# Patient Record
Sex: Female | Born: 1955 | Hispanic: Yes | Smoking: Former smoker
Health system: Southern US, Community
[De-identification: ages and names within clinical notes are randomized; demographics above are authoritative.]

## PROBLEM LIST (undated history)

## (undated) DIAGNOSIS — F329 Major depressive disorder, single episode, unspecified: Secondary | ICD-10-CM

## (undated) DIAGNOSIS — I10 Essential (primary) hypertension: Secondary | ICD-10-CM

## (undated) DIAGNOSIS — F32A Depression, unspecified: Secondary | ICD-10-CM

## (undated) DIAGNOSIS — R32 Unspecified urinary incontinence: Secondary | ICD-10-CM

## (undated) DIAGNOSIS — E785 Hyperlipidemia, unspecified: Secondary | ICD-10-CM

## (undated) DIAGNOSIS — F419 Anxiety disorder, unspecified: Secondary | ICD-10-CM

## (undated) HISTORY — PX: GALLBLADDER SURGERY: SHX652

## (undated) HISTORY — DX: Essential (primary) hypertension: I10

## (undated) HISTORY — DX: Hyperlipidemia, unspecified: E78.5

---

## 2000-09-03 ENCOUNTER — Emergency Department (HOSPITAL_COMMUNITY): Admission: EM | Admit: 2000-09-03 | Discharge: 2000-09-03 | Payer: Self-pay | Admitting: Emergency Medicine

## 2012-07-24 ENCOUNTER — Telehealth (INDEPENDENT_AMBULATORY_CARE_PROVIDER_SITE_OTHER): Payer: Self-pay | Admitting: Surgery

## 2012-07-24 NOTE — Telephone Encounter (Signed)
I received a request from [facility] in [place] for transfer of care of Realize Band follow-up/adjustments. Per Dr. Martin, it is ok to accept this patient for band adjustments and that the patient should be scheduled to see Andy Liepins, PA @ [facility]. Appointment scheduled for 08/06/12. Notified [facility] of appointment date and time. I also advised Christy @ [facility] that I need the patient demographic information and insurance information as soon as possible. cef °

## 2012-08-06 ENCOUNTER — Encounter (INDEPENDENT_AMBULATORY_CARE_PROVIDER_SITE_OTHER): Payer: Self-pay

## 2012-10-22 ENCOUNTER — Encounter (INDEPENDENT_AMBULATORY_CARE_PROVIDER_SITE_OTHER): Payer: Self-pay

## 2012-10-22 ENCOUNTER — Ambulatory Visit (INDEPENDENT_AMBULATORY_CARE_PROVIDER_SITE_OTHER): Payer: BC Managed Care – PPO | Admitting: Physician Assistant

## 2012-10-22 VITALS — BP 122/80 | HR 68 | Resp 16 | Ht 61.75 in | Wt 203.4 lb

## 2012-10-22 DIAGNOSIS — Z9884 Bariatric surgery status: Secondary | ICD-10-CM | POA: Insufficient documentation

## 2012-10-22 DIAGNOSIS — Z4651 Encounter for fitting and adjustment of gastric lap band: Secondary | ICD-10-CM

## 2012-10-22 NOTE — Progress Notes (Signed)
  HISTORY: Alexa Rice is a 57 y.o.female who received a Realize band in June 2010 in [place]. She is transferring her care here today. She has had several adjustments since her operation but we have no records of her fills. She reports doing well overall with the band but she's having increasing hunger and portions since last being seen in clinic at the beginning of the summer. She denies regurgitation or reflux symptoms. She would like a fill today.  VITAL SIGNS: Filed Vitals:   10/22/12 1213  BP: 122/80  Pulse: 68  Resp: 16    PHYSICAL EXAM: Physical exam reveals a very well-appearing 57 y.o.female in no apparent distress Neurologic: Awake, alert, oriented Psych: Bright affect, conversant Respiratory: Breathing even and unlabored. No stridor or wheezing Abdomen: Soft, nontender, nondistended to palpation. Incisions well-healed. No incisional hernias. Port easily palpated. Extremities: Atraumatic, good range of motion.  ASSESMENT: 57 y.o.  female  s/p Realize band.   PLAN: The patient's port was accessed with a 20G Huber needle without difficulty. Clear fluid was aspirated and 0.5 mL saline was added to the port. The patient was able to swallow water without difficulty following the procedure and was instructed to take clear liquids for the next 24-48 hours and advance slowly as tolerated.

## 2012-10-22 NOTE — Patient Instructions (Signed)

## 2012-11-03 ENCOUNTER — Encounter (INDEPENDENT_AMBULATORY_CARE_PROVIDER_SITE_OTHER): Payer: Self-pay

## 2012-11-19 ENCOUNTER — Encounter (INDEPENDENT_AMBULATORY_CARE_PROVIDER_SITE_OTHER): Payer: BC Managed Care – PPO

## 2013-03-12 ENCOUNTER — Emergency Department (HOSPITAL_BASED_OUTPATIENT_CLINIC_OR_DEPARTMENT_OTHER): Payer: BC Managed Care – PPO

## 2013-03-12 ENCOUNTER — Emergency Department (HOSPITAL_BASED_OUTPATIENT_CLINIC_OR_DEPARTMENT_OTHER)
Admission: EM | Admit: 2013-03-12 | Discharge: 2013-03-12 | Disposition: A | Discharge status: Home / Self Care | Payer: BC Managed Care – PPO | Attending: Emergency Medicine | Admitting: Emergency Medicine

## 2013-03-12 ENCOUNTER — Encounter (HOSPITAL_BASED_OUTPATIENT_CLINIC_OR_DEPARTMENT_OTHER): Payer: Self-pay | Admitting: Emergency Medicine

## 2013-03-12 DIAGNOSIS — Y9389 Activity, other specified: Secondary | ICD-10-CM | POA: Insufficient documentation

## 2013-03-12 DIAGNOSIS — S0990XA Unspecified injury of head, initial encounter: Secondary | ICD-10-CM | POA: Insufficient documentation

## 2013-03-12 DIAGNOSIS — Z79899 Other long term (current) drug therapy: Secondary | ICD-10-CM | POA: Insufficient documentation

## 2013-03-12 DIAGNOSIS — S59919A Unspecified injury of unspecified forearm, initial encounter: Secondary | ICD-10-CM

## 2013-03-12 DIAGNOSIS — Z880 Allergy status to penicillin: Secondary | ICD-10-CM | POA: Insufficient documentation

## 2013-03-12 DIAGNOSIS — Z87891 Personal history of nicotine dependence: Secondary | ICD-10-CM | POA: Insufficient documentation

## 2013-03-12 DIAGNOSIS — S161XXA Strain of muscle, fascia and tendon at neck level, initial encounter: Secondary | ICD-10-CM

## 2013-03-12 DIAGNOSIS — S6990XA Unspecified injury of unspecified wrist, hand and finger(s), initial encounter: Secondary | ICD-10-CM | POA: Insufficient documentation

## 2013-03-12 DIAGNOSIS — I10 Essential (primary) hypertension: Secondary | ICD-10-CM | POA: Insufficient documentation

## 2013-03-12 DIAGNOSIS — F3289 Other specified depressive episodes: Secondary | ICD-10-CM | POA: Insufficient documentation

## 2013-03-12 DIAGNOSIS — S4980XA Other specified injuries of shoulder and upper arm, unspecified arm, initial encounter: Secondary | ICD-10-CM | POA: Insufficient documentation

## 2013-03-12 DIAGNOSIS — S139XXA Sprain of joints and ligaments of unspecified parts of neck, initial encounter: Secondary | ICD-10-CM | POA: Insufficient documentation

## 2013-03-12 DIAGNOSIS — S59909A Unspecified injury of unspecified elbow, initial encounter: Secondary | ICD-10-CM | POA: Insufficient documentation

## 2013-03-12 DIAGNOSIS — Y92410 Unspecified street and highway as the place of occurrence of the external cause: Secondary | ICD-10-CM | POA: Insufficient documentation

## 2013-03-12 DIAGNOSIS — T148XXA Other injury of unspecified body region, initial encounter: Secondary | ICD-10-CM

## 2013-03-12 DIAGNOSIS — S46909A Unspecified injury of unspecified muscle, fascia and tendon at shoulder and upper arm level, unspecified arm, initial encounter: Secondary | ICD-10-CM | POA: Insufficient documentation

## 2013-03-12 DIAGNOSIS — F411 Generalized anxiety disorder: Secondary | ICD-10-CM | POA: Insufficient documentation

## 2013-03-12 DIAGNOSIS — F329 Major depressive disorder, single episode, unspecified: Secondary | ICD-10-CM | POA: Insufficient documentation

## 2013-03-12 DIAGNOSIS — Z862 Personal history of diseases of the blood and blood-forming organs and certain disorders involving the immune mechanism: Secondary | ICD-10-CM | POA: Insufficient documentation

## 2013-03-12 DIAGNOSIS — Z8639 Personal history of other endocrine, nutritional and metabolic disease: Secondary | ICD-10-CM | POA: Insufficient documentation

## 2013-03-12 DIAGNOSIS — S298XXA Other specified injuries of thorax, initial encounter: Secondary | ICD-10-CM | POA: Insufficient documentation

## 2013-03-12 HISTORY — DX: Anxiety disorder, unspecified: F41.9

## 2013-03-12 HISTORY — DX: Depression, unspecified: F32.A

## 2013-03-12 HISTORY — DX: Unspecified urinary incontinence: R32

## 2013-03-12 HISTORY — DX: Major depressive disorder, single episode, unspecified: F32.9

## 2013-03-12 MED ORDER — HYDROCODONE-ACETAMINOPHEN 5-325 MG PO TABS: 1.0000 | ORAL_TABLET | Freq: Four times a day (QID) | ORAL | Status: DC | PRN

## 2013-03-12 MED ORDER — HYDROCODONE-ACETAMINOPHEN 5-325 MG PO TABS
2.0000 | ORAL_TABLET | Freq: Once | ORAL | Status: AC
  Administered 2013-03-12: 2 via ORAL
  Filled 2013-03-12: qty 2
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
## 2013-03-12 NOTE — Discharge Instructions (Signed)
Cervical Sprain °A cervical sprain is an injury in the neck in which the strong, fibrous tissues (ligaments) that connect your neck bones stretch or tear. Cervical sprains can range from mild to severe. Severe cervical sprains can cause the neck vertebrae to be unstable. This can lead to damage of the spinal cord and can result in serious nervous system problems. The amount of time it takes for a cervical sprain to get better depends on the cause and extent of the injury. Most cervical sprains heal in 1 to 3 weeks. °CAUSES  °Severe cervical sprains may be caused by:  °· Contact sport injuries (such as from football, rugby, wrestling, hockey, auto racing, gymnastics, diving, martial arts, or boxing).   °· Motor vehicle collisions.   °· Whiplash injuries. This is an injury from a sudden forward-and backward whipping movement of the head and neck.  °· Falls.   °Mild cervical sprains may be caused by:  °· Being in an awkward position, such as while cradling a telephone between your ear and shoulder.   °· Sitting in a chair that does not offer proper support.   °· Working at a poorly designed computer station.   °· Looking up or down for long periods of time.   °SYMPTOMS  °· Pain, soreness, stiffness, or a burning sensation in the front, back, or sides of the neck. This discomfort may develop immediately after the injury or slowly, 24 hours or more after the injury.   °· Pain or tenderness directly in the middle of the back of the neck.   °· Shoulder or upper back pain.   °· Limited ability to move the neck.   °· Headache.   °· Dizziness.   °· Weakness, numbness, or tingling in the hands or arms.   °· Muscle spasms.   °· Difficulty swallowing or chewing.   °· Tenderness and swelling of the neck.   °DIAGNOSIS  °Most of the time your health care provider can diagnose a cervical sprain by taking your history and doing a physical exam. Your health care provider will ask about previous neck injuries and any known neck  problems, such as arthritis in the neck. X-rays may be taken to find out if there are any other problems, such as with the bones of the neck. Other tests, such as a CT scan or MRI, may also be needed.  °TREATMENT  °Treatment depends on the severity of the cervical sprain. Mild sprains can be treated with rest, keeping the neck in place (immobilization), and pain medicines. Severe cervical sprains are immediately immobilized. Further treatment is done to help with pain, muscle spasms, and other symptoms and may include: °· Medicines, such as pain relievers, numbing medicines, or muscle relaxants.   °· Physical therapy. This may involve stretching exercises, strengthening exercises, and posture training. Exercises and improved posture can help stabilize the neck, strengthen muscles, and help stop symptoms from returning.   °HOME CARE INSTRUCTIONS  °· Put ice on the injured area.   °· Put ice in a plastic bag.   °· Place a towel between your skin and the bag.   °· Leave the ice on for 15 20 minutes, 3 4 times a day.   °· If your injury was severe, you may have been given a cervical collar to wear. A cervical collar is a two-piece collar designed to keep your neck from moving while it heals. °· Do not remove the collar unless instructed by your health care provider. °· If you have long hair, keep it outside of the collar. °· Ask your health care provider before making any adjustments to your collar.   Minor adjustments may be required over time to improve comfort and reduce pressure on your chin or on the back of your head. °· If you are allowed to remove the collar for cleaning or bathing, follow your health care provider's instructions on how to do so safely. °· Keep your collar clean by wiping it with mild soap and water and drying it completely. If the collar you have been given includes removable pads, remove them every 1 2 days and hand wash them with soap and water. Allow them to air dry. They should be completely  dry before you wear them in the collar. °· If you are allowed to remove the collar for cleaning and bathing, wash and dry the skin of your neck. Check your skin for irritation or sores. If you see any, tell your health care provider. °· Do not drive while wearing the collar.   °· Only take over-the-counter or prescription medicines for pain, discomfort, or fever as directed by your health care provider.   °· Keep all follow-up appointments as directed by your health care provider.   °· Keep all physical therapy appointments as directed by your health care provider.   °· Make any needed adjustments to your workstation to promote good posture.   °· Avoid positions and activities that make your symptoms worse.   °· Warm up and stretch before being active to help prevent problems.   °SEEK MEDICAL CARE IF:  °· Your pain is not controlled with medicine.   °· You are unable to decrease your pain medicine over time as planned.   °· Your activity level is not improving as expected.   °SEEK IMMEDIATE MEDICAL CARE IF:  °· You develop any bleeding. °· You develop stomach upset. °· You have signs of an allergic reaction to your medicine.   °· Your symptoms get worse.   °· You develop new, unexplained symptoms.   °· You have numbness, tingling, weakness, or paralysis in any part of your body.   °MAKE SURE YOU:  °· Understand these instructions. °· Will watch your condition. °· Will get help right away if you are not doing well or get worse. °Document Released: 11/18/2006 Document Revised: 11/11/2012 Document Reviewed: 07/29/2012 °[employer]® Patient Information ©2014 [employer]. ° °Motor Vehicle Collision  °It is common to have multiple bruises and sore muscles after a motor vehicle collision (MVC). These tend to feel worse for the first 24 hours. You may have the most stiffness and soreness over the first several hours. You may also feel worse when you wake up the first morning after your collision. After this point, you will  usually begin to improve with each day. The speed of improvement often depends on the severity of the collision, the number of injuries, and the location and nature of these injuries. °HOME CARE INSTRUCTIONS  °· Put ice on the injured area. °· Put ice in a plastic bag. °· Place a towel between your skin and the bag. °· Leave the ice on for 15-20 minutes, 03-04 times a day. °· Drink enough fluids to keep your urine clear or pale yellow. Do not drink alcohol. °· Take a warm shower or bath once or twice a day. This will increase blood flow to sore muscles. °· You may return to activities as directed by your caregiver. Be careful when lifting, as this may aggravate neck or back pain. °· Only take over-the-counter or prescription medicines for pain, discomfort, or fever as directed by your caregiver. Do not use aspirin. This may increase bruising and bleeding. °SEEK IMMEDIATE MEDICAL CARE IF: °·   You have numbness, tingling, or weakness in the arms or legs. °· You develop severe headaches not relieved with medicine. °· You have severe neck pain, especially tenderness in the middle of the back of your neck. °· You have changes in bowel or bladder control. °· There is increasing pain in any area of the body. °· You have shortness of breath, lightheadedness, dizziness, or fainting. °· You have chest pain. °· You feel sick to your stomach (nauseous), throw up (vomit), or sweat. °· You have increasing abdominal discomfort. °· There is blood in your urine, stool, or vomit. °· You have pain in your shoulder (shoulder strap areas). °· You feel your symptoms are getting worse. °MAKE SURE YOU:  °· Understand these instructions. °· Will watch your condition. °· Will get help right away if you are not doing well or get worse. °Document Released: 01/21/2005 Document Revised: 04/15/2011 Document Reviewed: 06/20/2010 °[employer]® Patient Information ©2014 [employer]. ° °

## 2013-03-12 NOTE — ED Notes (Signed)
Patient transported to X-ray 

## 2013-06-03 ENCOUNTER — Ambulatory Visit (INDEPENDENT_AMBULATORY_CARE_PROVIDER_SITE_OTHER): Payer: BC Managed Care – PPO | Admitting: Physician Assistant

## 2013-06-03 ENCOUNTER — Encounter (INDEPENDENT_AMBULATORY_CARE_PROVIDER_SITE_OTHER): Payer: Self-pay

## 2013-06-03 VITALS — BP 128/74 | HR 76 | Temp 98.5°F | Ht 62.0 in | Wt 206.0 lb

## 2013-06-03 DIAGNOSIS — Z4651 Encounter for fitting and adjustment of gastric lap band: Secondary | ICD-10-CM

## 2013-06-03 NOTE — Progress Notes (Signed)
  HISTORY: Alexa Rice is a 58 y.o.female who received an Realize [place]band [place]. She comes in today with 3 lbs weight gain since her last visit in September 2014. She said she felt good restriction in the weeks following her last adjustment but over time, this began to decrease. She is having little issue with hunger but portion sizes are continuing to get larger. She has no complaint of regurgitation unless she eats breads or other foods that she previously had issues with. She was unable to follow-up sooner due to work obligations.  VITAL SIGNS: Filed Vitals:   06/03/13 1152  BP: 128/74  Pulse: 76  Temp: 98.5 F (36.9 C)    PHYSICAL EXAM: Physical exam reveals a very well-appearing 58 y.o.female in no apparent distress Neurologic: Awake, alert, oriented Psych: Bright affect, conversant Respiratory: Breathing even and unlabored. No stridor or wheezing Abdomen: Soft, nontender, nondistended to palpation. Incisions well-healed. No incisional hernias. Port easily palpated. Extremities: Atraumatic, good range of motion.  ASSESMENT: 58 y.o.  female  s/p Realize lap-band.   PLAN: The patient's port was accessed with a 20G Huber needle without difficulty. Clear fluid was aspirated and 0.5 mL saline was added to the port. The patient was able to swallow water without difficulty following the procedure and was instructed to take clear liquids for the next 24-48 hours and advance slowly as tolerated.

## 2013-06-03 NOTE — Patient Instructions (Signed)

## 2013-08-26 ENCOUNTER — Encounter (INDEPENDENT_AMBULATORY_CARE_PROVIDER_SITE_OTHER): Payer: BC Managed Care – PPO

## 2013-09-02 ENCOUNTER — Encounter (INDEPENDENT_AMBULATORY_CARE_PROVIDER_SITE_OTHER): Payer: BC Managed Care – PPO

## 2013-09-16 ENCOUNTER — Encounter (INDEPENDENT_AMBULATORY_CARE_PROVIDER_SITE_OTHER): Payer: BC Managed Care – PPO

## 2013-12-07 ENCOUNTER — Other Ambulatory Visit (HOSPITAL_COMMUNITY): Payer: Self-pay | Admitting: Internal Medicine

## 2013-12-07 DIAGNOSIS — Z1231 Encounter for screening mammogram for malignant neoplasm of breast: Secondary | ICD-10-CM

## 2013-12-17 ENCOUNTER — Ambulatory Visit (HOSPITAL_COMMUNITY)
Admission: RE | Admit: 2013-12-17 | Discharge: 2013-12-17 | Disposition: A | Discharge status: Home / Self Care | Payer: BC Managed Care – PPO | Source: Ambulatory Visit | Attending: Internal Medicine | Admitting: Internal Medicine

## 2013-12-17 DIAGNOSIS — Z1231 Encounter for screening mammogram for malignant neoplasm of breast: Secondary | ICD-10-CM | POA: Diagnosis present

## 2015-04-07 ENCOUNTER — Other Ambulatory Visit: Payer: Self-pay

## 2015-04-07 DIAGNOSIS — M79621 Pain in right upper arm: Secondary | ICD-10-CM

## 2015-05-05 ENCOUNTER — Other Ambulatory Visit: Payer: Self-pay | Admitting: Internal Medicine

## 2015-05-05 DIAGNOSIS — M79621 Pain in right upper arm: Secondary | ICD-10-CM

## 2015-05-12 ENCOUNTER — Other Ambulatory Visit: Payer: BC Managed Care – PPO

## 2015-05-15 ENCOUNTER — Ambulatory Visit
Admission: RE | Admit: 2015-05-15 | Discharge: 2015-05-15 | Disposition: A | Discharge status: Home / Self Care | Payer: BC Managed Care – PPO | Source: Ambulatory Visit | Attending: Internal Medicine | Admitting: Internal Medicine

## 2015-05-15 DIAGNOSIS — M79621 Pain in right upper arm: Secondary | ICD-10-CM

## 2016-04-09 ENCOUNTER — Other Ambulatory Visit: Payer: Self-pay | Admitting: Internal Medicine

## 2016-04-09 DIAGNOSIS — Z1231 Encounter for screening mammogram for malignant neoplasm of breast: Secondary | ICD-10-CM

## 2016-05-20 ENCOUNTER — Ambulatory Visit: Payer: BC Managed Care – PPO

## 2016-07-23 ENCOUNTER — Other Ambulatory Visit: Payer: Self-pay | Admitting: Internal Medicine

## 2016-07-23 DIAGNOSIS — R5381 Other malaise: Secondary | ICD-10-CM

## 2016-08-14 ENCOUNTER — Other Ambulatory Visit: Payer: Self-pay | Admitting: Internal Medicine

## 2016-08-14 DIAGNOSIS — Z780 Asymptomatic menopausal state: Secondary | ICD-10-CM

## 2016-10-30 ENCOUNTER — Other Ambulatory Visit: Payer: BC Managed Care – PPO

## 2016-10-30 ENCOUNTER — Ambulatory Visit: Payer: BC Managed Care – PPO

## 2016-11-11 ENCOUNTER — Inpatient Hospital Stay: Admission: RE | Admit: 2016-11-11 | Payer: BC Managed Care – PPO | Source: Ambulatory Visit

## 2016-11-11 ENCOUNTER — Inpatient Hospital Stay
Admission: RE | Admit: 2016-11-11 | Discharge: 2016-11-11 | Disposition: A | Discharge status: Home / Self Care | Payer: BC Managed Care – PPO | Source: Ambulatory Visit | Attending: Internal Medicine | Admitting: Internal Medicine

## 2016-12-10 ENCOUNTER — Ambulatory Visit
Admission: RE | Admit: 2016-12-10 | Discharge: 2016-12-10 | Disposition: A | Discharge status: Home / Self Care | Payer: BC Managed Care – PPO | Source: Ambulatory Visit | Attending: Internal Medicine | Admitting: Internal Medicine

## 2016-12-10 DIAGNOSIS — Z780 Asymptomatic menopausal state: Secondary | ICD-10-CM

## 2016-12-10 DIAGNOSIS — Z1231 Encounter for screening mammogram for malignant neoplasm of breast: Secondary | ICD-10-CM

## 2021-04-25 ENCOUNTER — Other Ambulatory Visit: Payer: Self-pay | Admitting: Surgical Oncology

## 2021-04-25 DIAGNOSIS — N6320 Unspecified lump in the left breast, unspecified quadrant: Secondary | ICD-10-CM

## 2021-05-07 ENCOUNTER — Ambulatory Visit
Admission: RE | Admit: 2021-05-07 | Discharge: 2021-05-07 | Disposition: A | Discharge status: Home / Self Care | Payer: BC Managed Care – PPO | Source: Ambulatory Visit | Attending: Surgical Oncology | Admitting: Surgical Oncology

## 2021-05-07 ENCOUNTER — Other Ambulatory Visit (HOSPITAL_COMMUNITY): Payer: Self-pay | Admitting: Diagnostic Radiology

## 2021-05-07 ENCOUNTER — Other Ambulatory Visit: Payer: BC Managed Care – PPO

## 2021-05-07 DIAGNOSIS — N6320 Unspecified lump in the left breast, unspecified quadrant: Secondary | ICD-10-CM

## 2021-05-07 HISTORY — PX: BREAST BIOPSY: SHX20

## 2021-05-07 MED ORDER — GADOBUTROL 1 MMOL/ML IV SOLN
8.0000 mL | Freq: Once | INTRAVENOUS | Status: AC | PRN
  Administered 2021-05-07: 8 mL via INTRAVENOUS

## 2021-05-09 ENCOUNTER — Inpatient Hospital Stay: Admission: RE | Admit: 2021-05-09 | Payer: BC Managed Care – PPO | Source: Ambulatory Visit

## 2023-10-21 ENCOUNTER — Other Ambulatory Visit: Payer: Self-pay | Admitting: Medical Genetics

## 2023-11-12 ENCOUNTER — Other Ambulatory Visit

## 2023-11-12 DIAGNOSIS — Z006 Encounter for examination for normal comparison and control in clinical research program: Secondary | ICD-10-CM

## 2023-11-22 LAB — GENECONNECT MOLECULAR SCREEN: Genetic Analysis Overall Interpretation: NEGATIVE

## 2024-01-06 ENCOUNTER — Ambulatory Visit: Admitting: Podiatry

## 2024-01-06 DIAGNOSIS — B351 Tinea unguium: Secondary | ICD-10-CM | POA: Diagnosis not present

## 2024-01-06 NOTE — Progress Notes (Signed)
 Chief Complaint  Patient presents with   Nail Problem    Pt. Had cancer treatment 2023. Unable to attend to nails. Fungal nails bilateral great toes. Using OTC antifungals. Non diabetic.    Discussed the use of AI scribe software for clinical note transcription with the patient, who gave verbal consent to proceed.  History of Present Illness Alexa Rice is a 68 year old female who presents with nail fungus and difficulty in self-care due to reduced mobility.  She has been experiencing nail fungus for approximately two months and has attempted to treat it with over-the-counter products, including patches and a paint-on solution, without any improvement. She is unable to recall the specific names of the products used.  There is a vertical crack in one of her nails (left fourth toenail), but no associated pain or bleeding. No itching between the toes or issues with athlete's foot, either currently or in the past.  Decreased mobility and difficulty reaching her feet have impacted her ability to care for her nails. This difficulty is compounded by her age and vision issues.  In 2023, she underwent cancer treatment, including a mastectomy, and was advised by her oncologist to avoid nail salons to prevent infections due to compromised immunity.    Past Medical History:  Diagnosis Date   Anxiety    Bladder leak    Depression    Hyperlipidemia    Hypertension    Past Surgical History:  Procedure Laterality Date   BREAST BIOPSY Left 05/07/2021   GALLBLADDER SURGERY     Allergies  Allergen Reactions   Penicillins Shortness Of Breath    Synthetic   Physical Exam EXTREMITIES: Left hallux nail with signs of fungal involvement. Samples from bilateral hallux nails obtained for fungal culture. Good pulses in extremities.  Interspaces are clear of maceration and debris.  No peripheral edema is appreciated to the ankles or feet.  No open lesions are noted.  Capillary refill is within  normal limits.  Epicritic sensation is intact  Results Procedure: Nail Clipping for Fungal Culture Description: Samples from bilateral hallux nails were obtained for fungal culture.  The remainder the nails were trimmed with sterile nail nippers.   Assessment/Plan of Care: 1. Dermatophytosis of nail    Assessment & Plan Onychomycosis Chronic onychomycosis with no improvement after two months of over-the-counter treatment. Left hallux nail shows signs of fungal involvement. Differential diagnosis includes various organisms causing nail fungus, some of which may not respond to over-the-counter treatments. Culture results may take up to three weeks. - Trimmed nails today - Sent nail clippings for fungal culture to identify causative organism - Will await culture results in 2-3 weeks to tailor treatment - Scheduled follow-up in three months for re-evaluation and nail trimming  Reduced mobility impacting foot care Reduced mobility due to age and post-cancer treatment, making it difficult to reach and care for feet. No current issues with athlete's foot or itching between toes. - Scheduled follow-up in three months for re-evaluation and nail trimming - However, once the fungus is resolved, she would no longer qualify for this care.  She does not meet the qualifying criteria per Medicare guidelines for ongoing at risk footcare services.  This would be a self-pay service once the fungus has resolved.  Follow-up 3 months for fungal nail recheck/nail trimming   Shlomo Seres DSABRA Imperial, DPM, FACFAS [facility]     [address]  [place], [place] [address]                Office [phone]  Fax [phone]

## 2024-01-12 ENCOUNTER — Other Ambulatory Visit: Payer: Self-pay | Admitting: Podiatry

## 2024-01-14 ENCOUNTER — Ambulatory Visit: Payer: Self-pay | Admitting: Podiatry

## 2024-02-06 IMAGING — MG MM BREAST LOCALIZATION CLIP
4 series · 4 of 12 positions shown · non-contrast
Comparison: Previous exam(s).

CLINICAL DATA: Evaluate BARBELL biopsy clip placement following MR
guided LEFT breast biopsy.

EXAM:
3D DIAGNOSTIC LEFT MAMMOGRAM POST MRI BIOPSY

[L ML synth-2D]
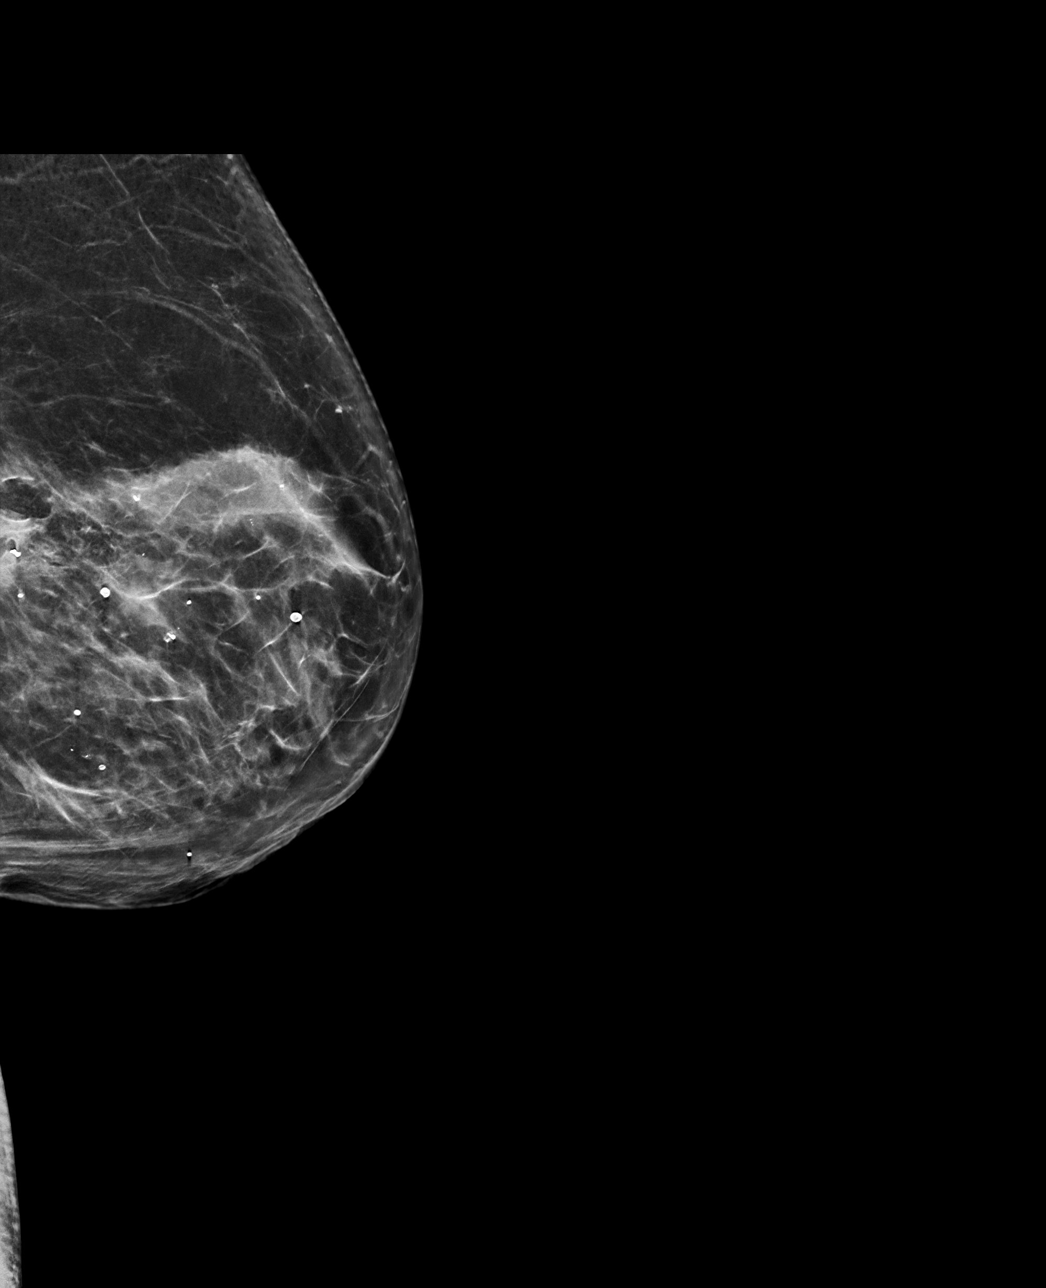

[L CC synth-2D]
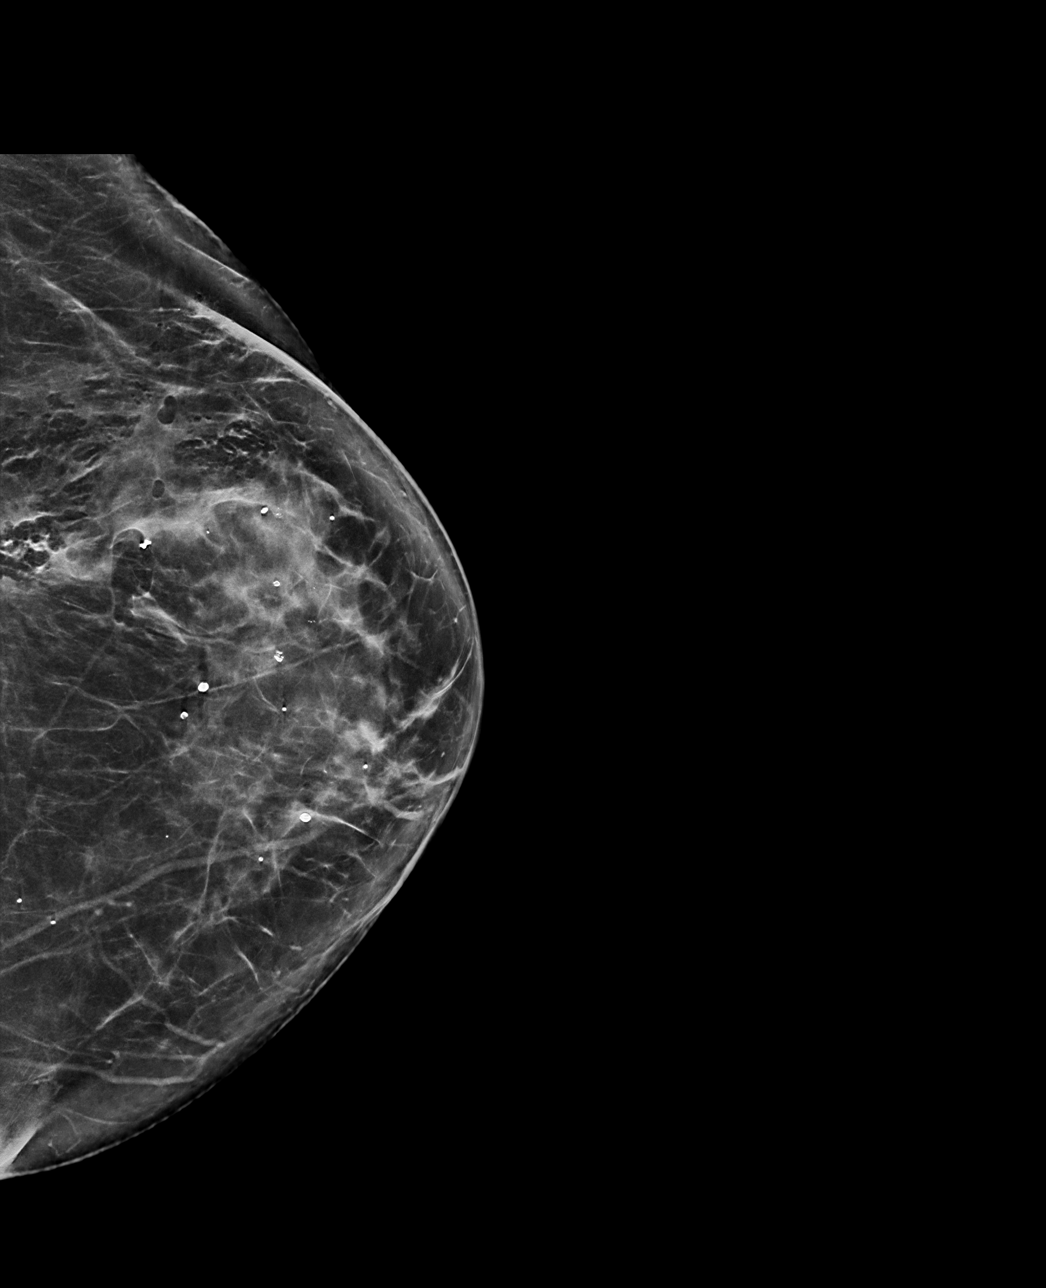

[L ML tomo · tomo slice 44/87.0]
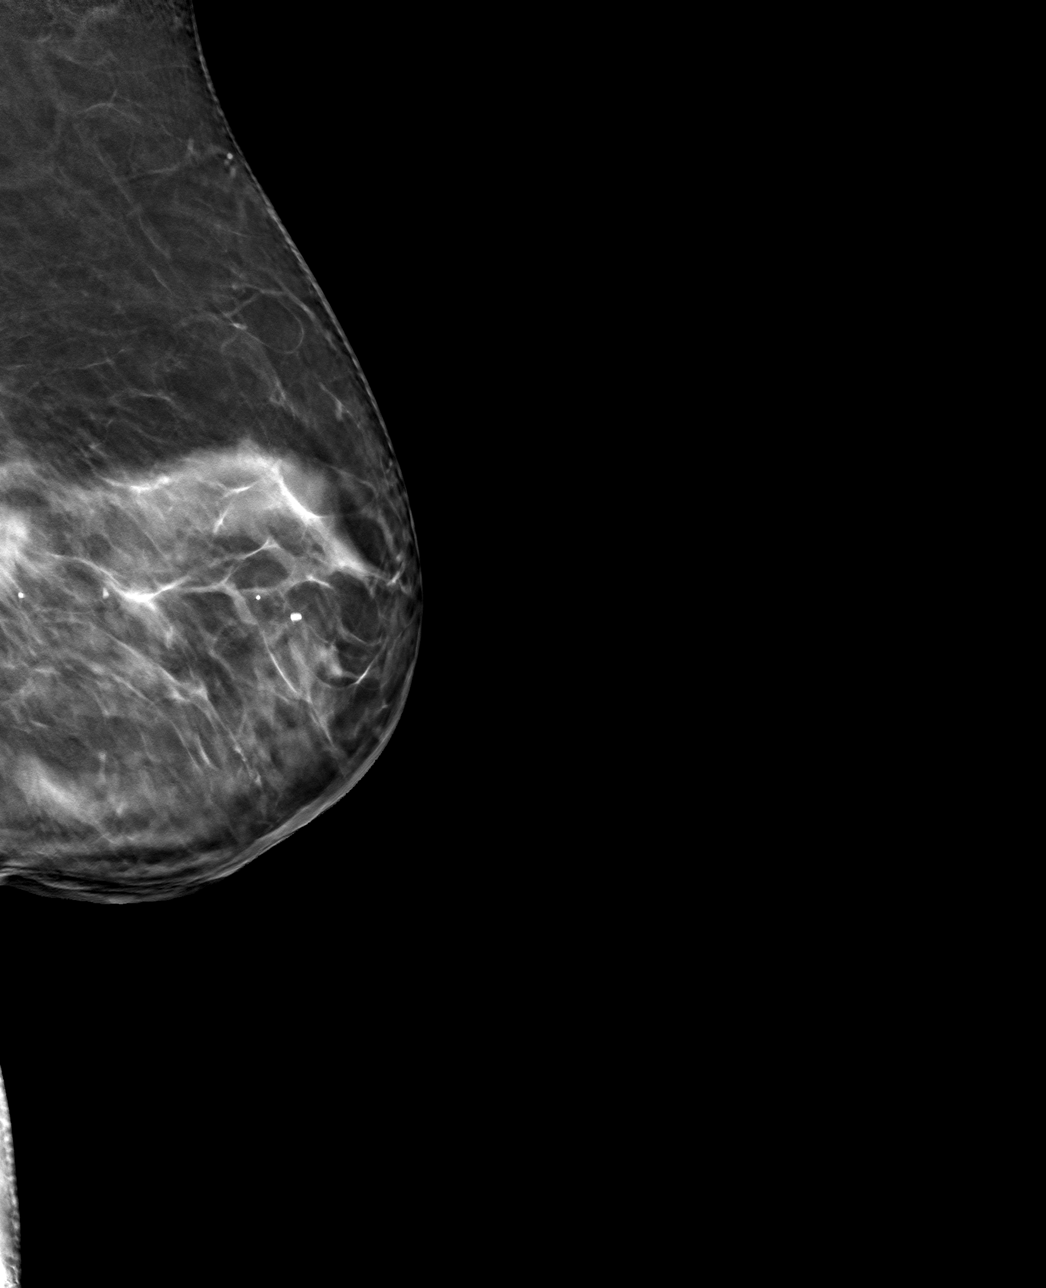

[L CC tomo · tomo slice 43/84.0]
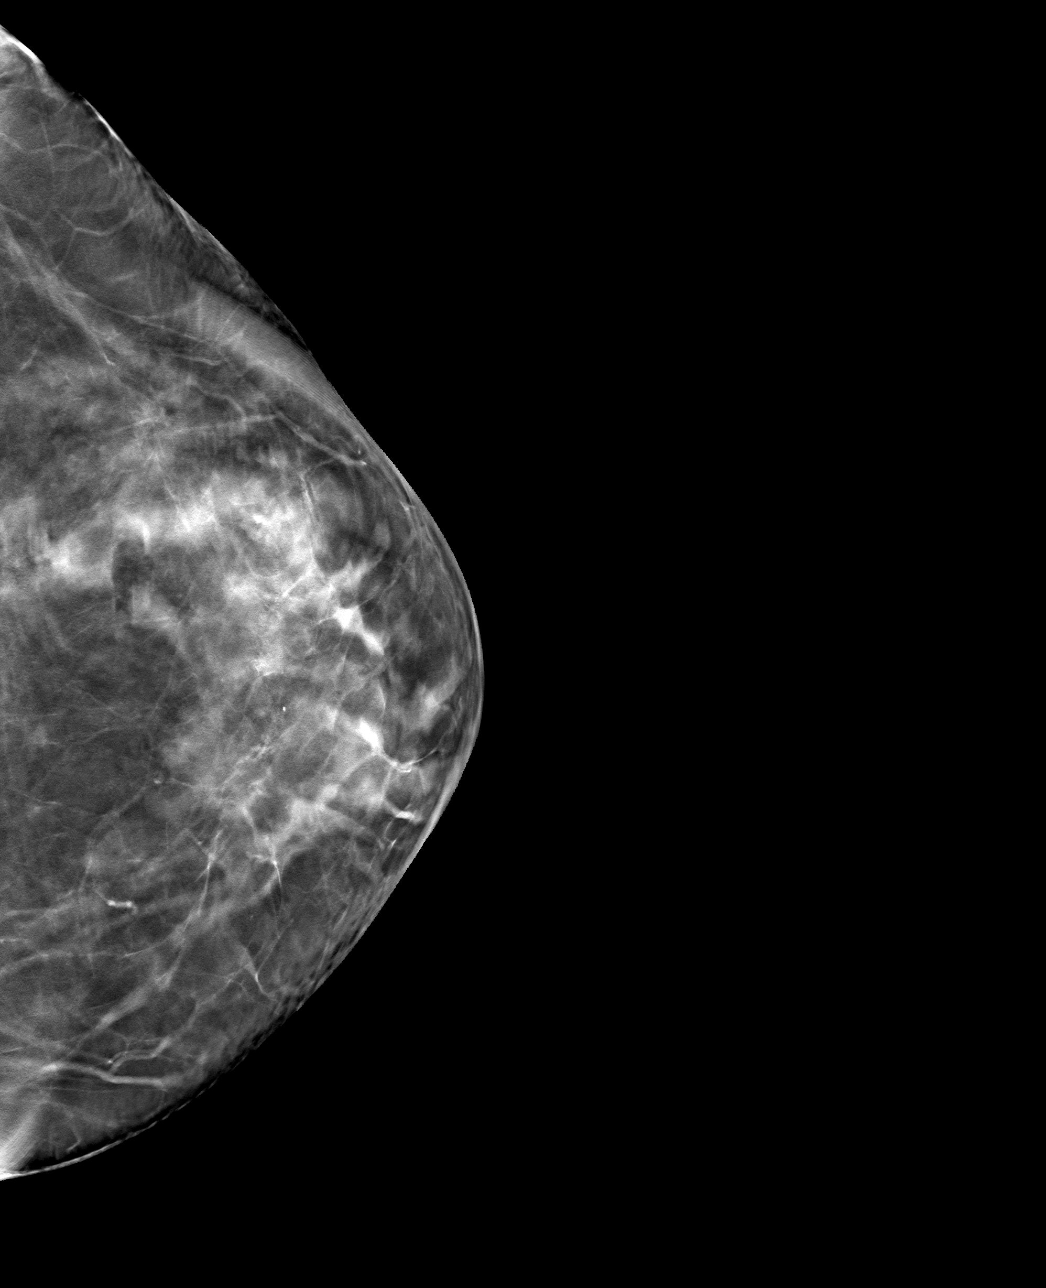

[4 of 12 positions shown; findings below may reference images not displayed]

FINDINGS: 3D Mammographic images were obtained following MR guided biopsy of
the 0.5 cm mass in the posterior OUTER LEFT breast. The BARBELL
biopsy marking clip is in expected position at the site of biopsy.
IMPRESSION: Appropriate positioning of the BARBELL shaped biopsy marking clip at
the site of biopsy in the posterior OUTER LEFT breast.

Final Assessment: Post Procedure Mammograms for Marker Placement
